# Patient Record
Sex: Female | Born: 1975 | Race: White | Hispanic: No | Marital: Married | Smoking: Never smoker
Health system: Southern US, Community
[De-identification: ages and names within clinical notes are randomized; demographics above are authoritative.]

## PROBLEM LIST (undated history)

## (undated) DIAGNOSIS — I10 Essential (primary) hypertension: Secondary | ICD-10-CM

## (undated) HISTORY — DX: Essential (primary) hypertension: I10

---

## 2016-02-24 ENCOUNTER — Ambulatory Visit: Payer: Self-pay | Admitting: Podiatry

## 2016-03-23 ENCOUNTER — Ambulatory Visit (INDEPENDENT_AMBULATORY_CARE_PROVIDER_SITE_OTHER): Payer: BLUE CROSS/BLUE SHIELD | Admitting: Podiatry

## 2016-03-23 ENCOUNTER — Encounter: Payer: Self-pay | Admitting: Podiatry

## 2016-03-23 VITALS — BP 139/87 | HR 71 | Resp 16

## 2016-03-23 DIAGNOSIS — B070 Plantar wart: Secondary | ICD-10-CM

## 2016-03-23 HISTORY — DX: Plantar wart: B07.0

## 2016-03-23 NOTE — Patient Instructions (Signed)
Take dressing off in 8 hours and wash the foot with soap and water. If it is hurting or becomes uncomfortable before the 8 hours, go ahead and remove the bandage and wash the area.  If it blisters, apply antibiotic ointment and a band-aid.  Monitor for any signs/symptoms of infection. Call the office immediately if any occur or go directly to the emergency room. Call with any questions/concerns.   

## 2016-03-23 NOTE — Progress Notes (Signed)
   Subjective:    Patient ID: Theresa West, female    DOB: 04/17/1975, 41 y.o.   MRN: [id number]  HPI [age] year old female presents the office today for concerns of a possible wart to the bottom of her left foot which is been ongoing for several years. She has tried multiple conservative treatments over-the-counter without any improvement. She states the areas painful with pressure in shoes. Denies any redness or drainage or any swelling. No other complaints at this time.   Review of Systems  All other systems reviewed and are negative.      Objective:   Physical Exam General: AAO x3, NAD  Dermatological: Hyperkeratotic lesion left foot sub-metatarsal 5. Upon debridement there is pinpoint bleeding and evidence of verruca. There is no swelling erythema, drainage or pus. No other open lesions or pre-ulcerative lesion identified today.  Vascular: Dorsalis Pedis artery and Posterior Tibial artery pedal pulses are 2/4 bilateral with immedate capillary fill time. Pedal hair growth present. There is no pain with calf compression, swelling, warmth, erythema.   Neruologic: Grossly intact via light touch bilateral. Vibratory intact via tuning fork bilateral. Protective threshold with Semmes Wienstein monofilament intact to all pedal sites bilateral.   Musculoskeletal: No gross boney pedal deformities bilateral. No pain, crepitus, or limitation noted with foot and ankle range of motion bilateral. Muscular strength 5/5 in all groups tested bilateral.  Gait: Unassisted, Nonantalgic.      Assessment & Plan:  41 year old female left foot verruca -Treatment options discussed including all alternatives, risks, and complications -Etiology of symptoms were discussed -Lesion was debrided today without complications. The area was cleaned. Cantharone was applied followed by an occlusive bandage. Post procedure instructions were discussed. -Discussed the symptoms continued surgical excision. -RTC in 3 weeks  or sooner if needed  Ovid CurdMatthew Gregorio Worley, DPM

## 2016-04-14 ENCOUNTER — Ambulatory Visit: Payer: BLUE CROSS/BLUE SHIELD | Admitting: Podiatry

## 2016-04-20 ENCOUNTER — Ambulatory Visit (INDEPENDENT_AMBULATORY_CARE_PROVIDER_SITE_OTHER): Payer: BLUE CROSS/BLUE SHIELD | Admitting: Podiatry

## 2016-04-20 ENCOUNTER — Encounter: Payer: Self-pay | Admitting: Podiatry

## 2016-04-20 DIAGNOSIS — B070 Plantar wart: Secondary | ICD-10-CM

## 2016-04-20 NOTE — Patient Instructions (Signed)
Take dressing off in 8 hours and wash the foot with soap and water. If it is hurting or becomes uncomfortable before the 8 hours, go ahead and remove the bandage and wash the area.  If it blisters, apply antibiotic ointment and a band-aid.  Monitor for any signs/symptoms of infection. Call the office immediately if any occur or go directly to the emergency room. Call with any questions/concerns.   

## 2016-04-24 NOTE — Progress Notes (Signed)
Subjective: 41 year old female presents the also concerns of a wart to the bottom of her left foot which is been ongoing for quite some time. [facility] and has improved although she is requesting another treatment today. She had some pain after the first day of the medication last appointment otherwise she was doing well. She denies any redness or drainage or any other signs of infection. Denies any systemic complaints such as fevers, chills, nausea, vomiting. No acute changes since last appointment, and no other complaints at this time.   Objective: AAO x3, NAD DP/PT pulses palpable bilaterally, CRT less than 3 seconds Hyperkeratotic lesion left foot some metatarsal 5. Upon debridement there was evidence of verruca although it does appear to be improved. Verruca does continue. Has not a any redness or drainage or any other signs of infection. No open lesions or pre-ulcerative lesions.  No pain with calf compression, swelling, warmth, erythema  Assessment: Verruca left foot.  Plan: -All treatment options discussed with the patient including all alternatives, risks, complications.  -Lesion sharply debrided without complications. The area was cleaned and Cantharone was applied followed by an occlusive bandage. Post procedure instructions were discussed. Monitor for infection. -RTC 3 weeks or sooner if needed. -Patient encouraged to call the office with any questions, concerns, change in symptoms.   Ovid CurdMatthew Wagoner, DPM

## 2016-05-12 DIAGNOSIS — Z818 Family history of other mental and behavioral disorders: Secondary | ICD-10-CM | POA: Insufficient documentation

## 2016-05-19 ENCOUNTER — Ambulatory Visit: Payer: BLUE CROSS/BLUE SHIELD | Admitting: Podiatry

## 2017-05-19 DIAGNOSIS — I10 Essential (primary) hypertension: Secondary | ICD-10-CM | POA: Insufficient documentation

## 2018-06-27 ENCOUNTER — Other Ambulatory Visit: Payer: Self-pay | Admitting: Family Medicine

## 2018-06-27 DIAGNOSIS — Z1231 Encounter for screening mammogram for malignant neoplasm of breast: Secondary | ICD-10-CM

## 2018-09-19 ENCOUNTER — Ambulatory Visit
Admission: RE | Admit: 2018-09-19 | Discharge: 2018-09-19 | Disposition: A | Discharge status: Home / Self Care | Payer: BC Managed Care – PPO | Source: Ambulatory Visit | Attending: Family Medicine | Admitting: Family Medicine

## 2018-09-19 DIAGNOSIS — Z1231 Encounter for screening mammogram for malignant neoplasm of breast: Secondary | ICD-10-CM | POA: Insufficient documentation

## 2020-12-24 IMAGING — MG DIGITAL SCREENING BILATERAL MAMMOGRAM WITH TOMO AND CAD
6 of 10 series · 6 of 30 positions shown · non-contrast
Comparison: None.

ACR Breast Density Category a: The breast tissue is almost entirely
fatty.

CLINICAL DATA: Screening.

EXAM:
DIGITAL SCREENING BILATERAL MAMMOGRAM WITH TOMO AND CAD

[R MLO synth-2D]
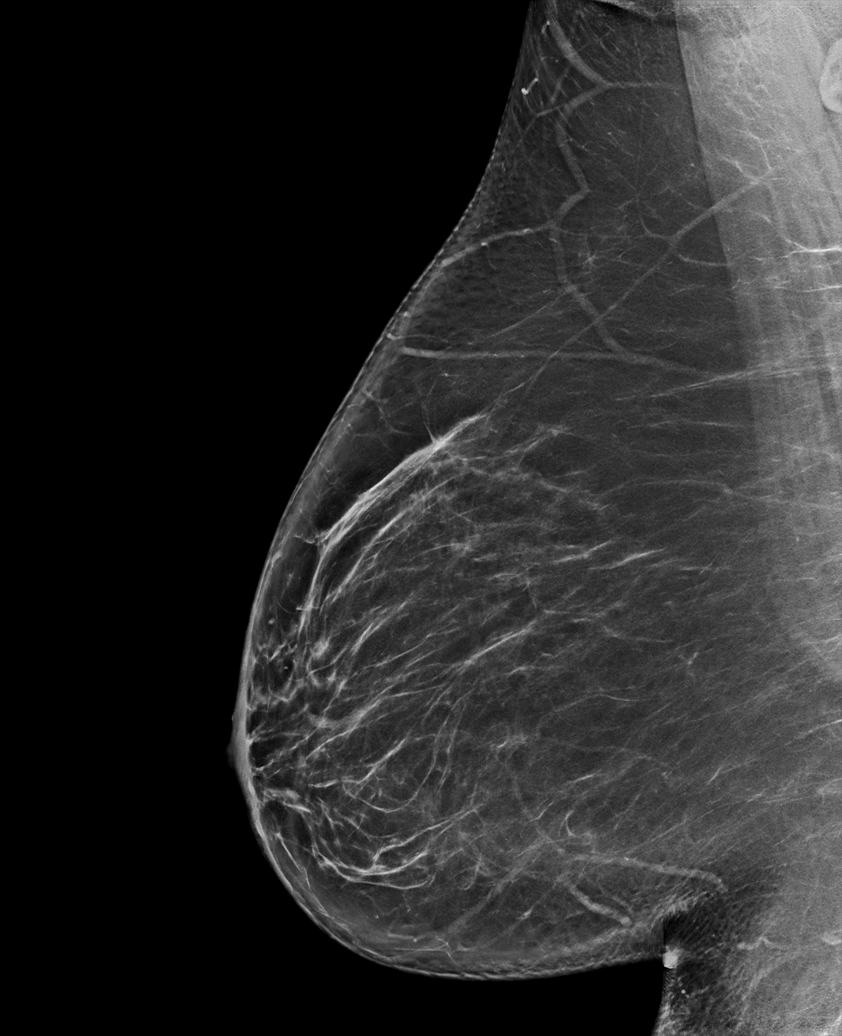

[L CC synth-2D]
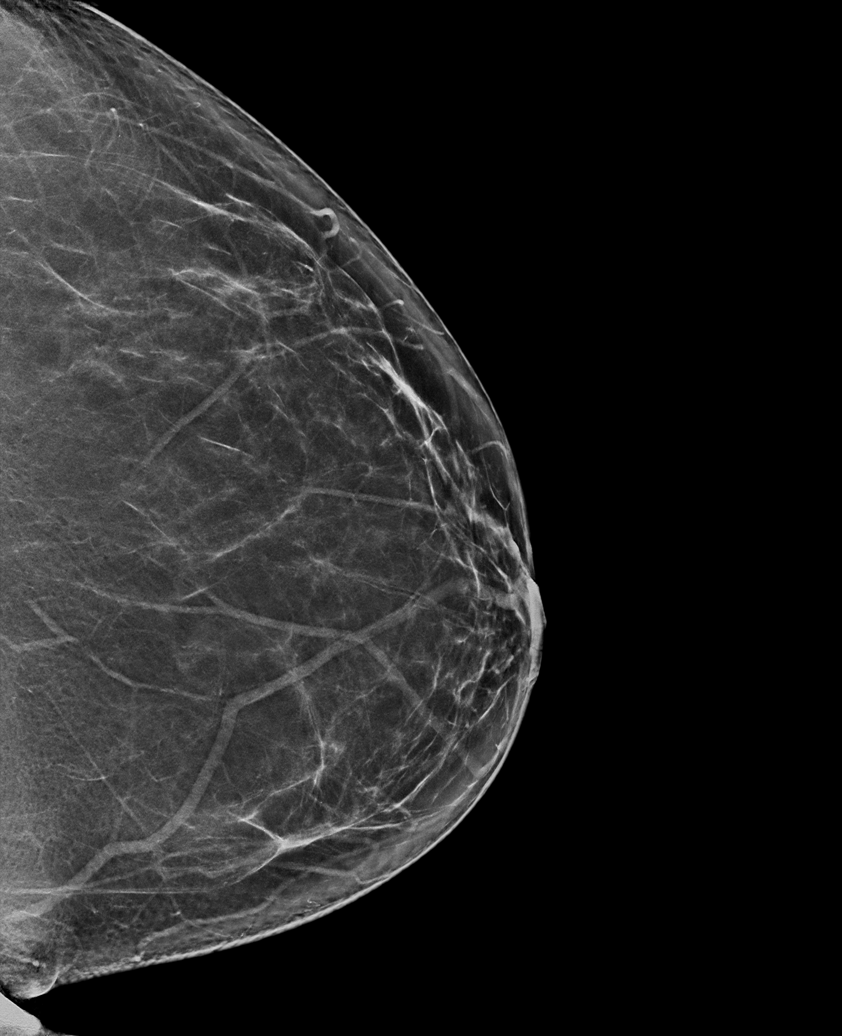

[R CC synth-2D]
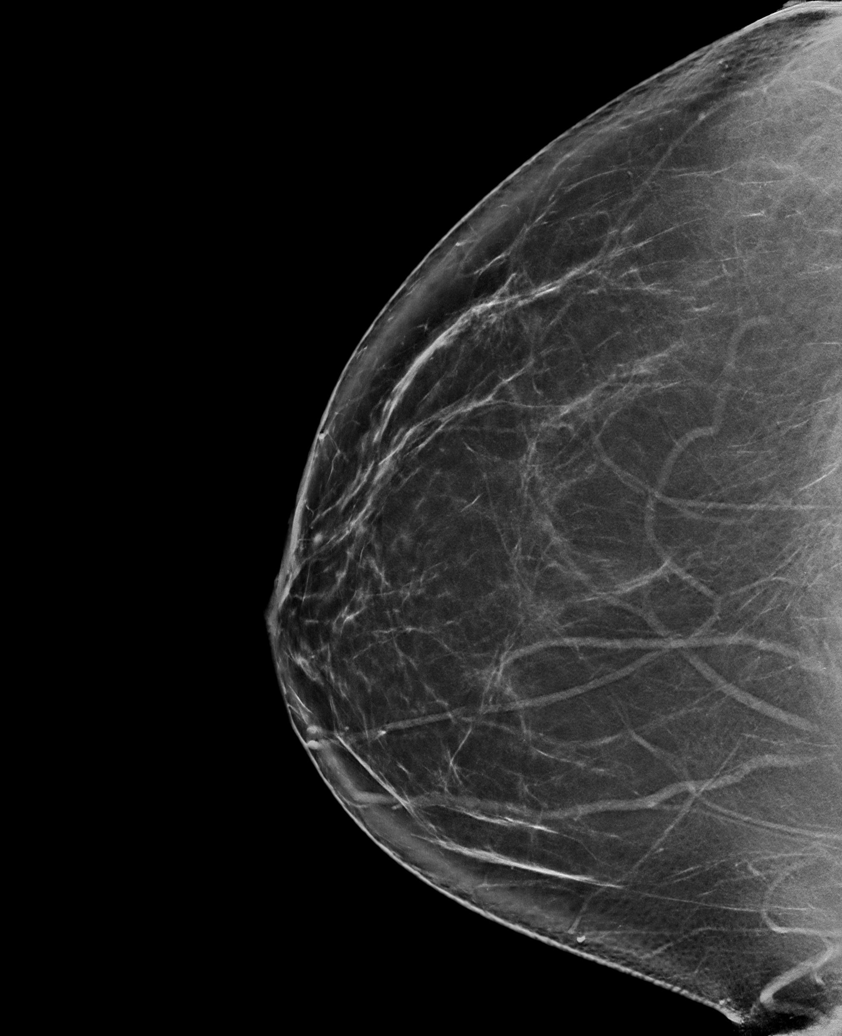

[L MLO synth-2D]
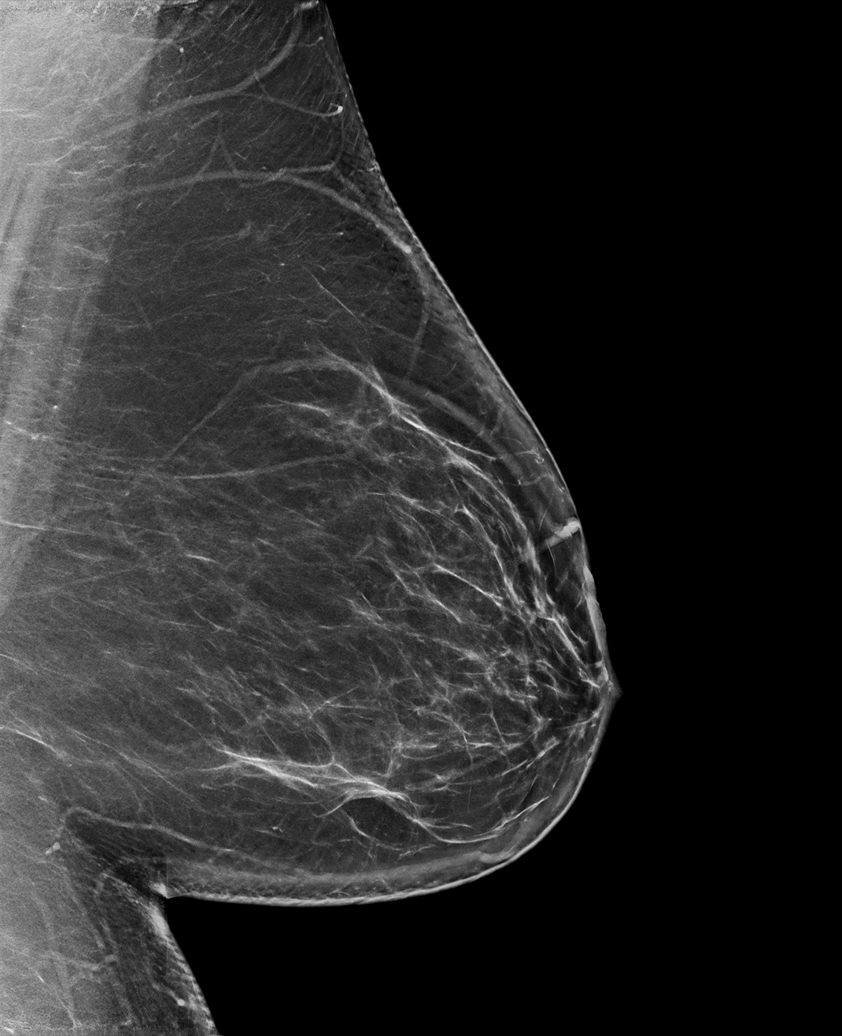

[L XCCL synth-2D]
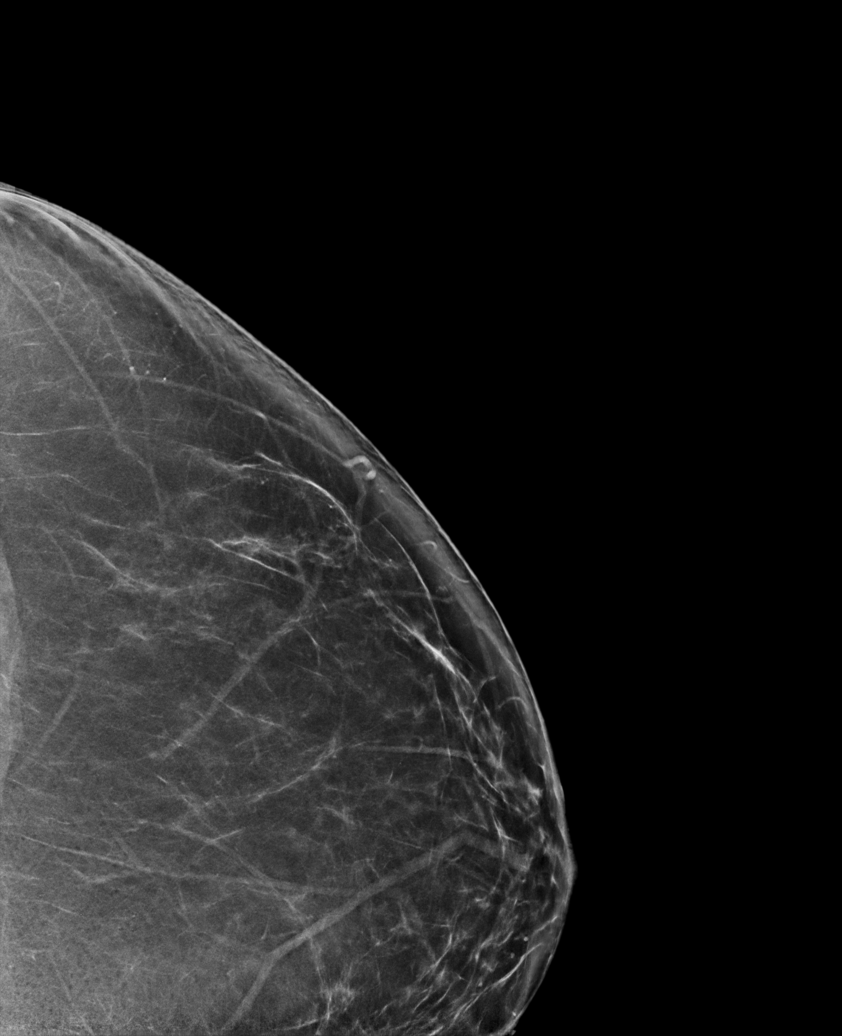

[L MLO tomo · tomo slice 47/93.0]
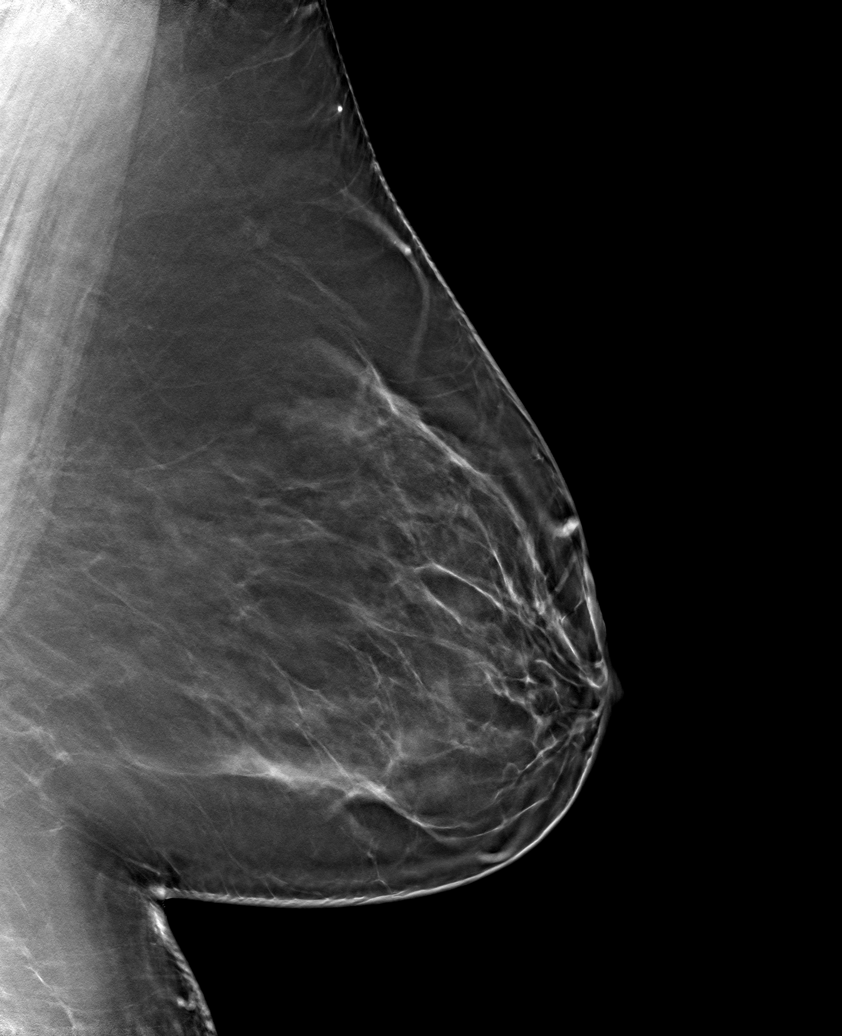

[6 of 30 positions shown; findings below may reference images not displayed]

FINDINGS: There are no findings suspicious for malignancy. Images were
processed with CAD.
IMPRESSION: No mammographic evidence of malignancy. A result letter of this
screening mammogram will be mailed directly to the patient.

RECOMMENDATION:
Screening mammogram in one year. (Code:[id number])

BI-RADS CATEGORY  1: Negative.

## 2021-04-25 ENCOUNTER — Ambulatory Visit: Payer: Self-pay | Admitting: Internal Medicine

## 2021-05-16 ENCOUNTER — Ambulatory Visit: Payer: BC Managed Care – PPO | Admitting: Family

## 2021-07-08 ENCOUNTER — Ambulatory Visit: Payer: Self-pay | Admitting: Family

## 2023-10-03 ENCOUNTER — Ambulatory Visit (INDEPENDENT_AMBULATORY_CARE_PROVIDER_SITE_OTHER): Admitting: Family Medicine

## 2023-10-03 ENCOUNTER — Encounter: Payer: Self-pay | Admitting: Family Medicine

## 2023-10-03 VITALS — BP 153/90 | HR 84 | Ht 68.0 in | Wt 305.0 lb

## 2023-10-03 DIAGNOSIS — I10 Essential (primary) hypertension: Secondary | ICD-10-CM

## 2023-10-03 DIAGNOSIS — G44229 Chronic tension-type headache, not intractable: Secondary | ICD-10-CM | POA: Diagnosis not present

## 2023-10-03 DIAGNOSIS — Z136 Encounter for screening for cardiovascular disorders: Secondary | ICD-10-CM

## 2023-10-03 DIAGNOSIS — N951 Menopausal and female climacteric states: Secondary | ICD-10-CM | POA: Diagnosis not present

## 2023-10-03 DIAGNOSIS — Z0001 Encounter for general adult medical examination with abnormal findings: Secondary | ICD-10-CM | POA: Diagnosis not present

## 2023-10-03 DIAGNOSIS — Z0000 Encounter for general adult medical examination without abnormal findings: Secondary | ICD-10-CM

## 2023-10-03 DIAGNOSIS — Z1231 Encounter for screening mammogram for malignant neoplasm of breast: Secondary | ICD-10-CM

## 2023-10-03 DIAGNOSIS — R7303 Prediabetes: Secondary | ICD-10-CM

## 2023-10-03 DIAGNOSIS — Z1211 Encounter for screening for malignant neoplasm of colon: Secondary | ICD-10-CM

## 2023-10-03 DIAGNOSIS — Z23 Encounter for immunization: Secondary | ICD-10-CM

## 2023-10-03 MED ORDER — AMLODIPINE BESYLATE 10 MG PO TABS: 10.0000 mg | ORAL_TABLET | Freq: Every day | ORAL | 3 refills | Status: DC

## 2023-10-03 MED ORDER — LISINOPRIL 10 MG PO TABS: 10.0000 mg | ORAL_TABLET | Freq: Every day | ORAL | 0 refills | Status: DC

## 2023-10-03 NOTE — Progress Notes (Addendum)
 New patient visit   Patient: Theresa West   DOB: 09/14/1975   47 y.o. Female  MRN: [id number] Visit Date: 10/03/2023  Today's healthcare provider: LAURAINE LOISE BUOY, DO   Chief Complaint  Patient presents with   New Patient (Initial Visit)    No concerns, agreed tdap, wait for pap(on menstrual), clinic out of other vaccines    Subjective    Theresa West is a 48 y.o. female who presents today as a new patient to establish care.  HPI HPI     New Patient (Initial Visit)    Additional comments: No concerns, agreed tdap, wait for pap(on menstrual), clinic out of other vaccines       Last edited by Thelbert Eulalio HERO, CMA on 10/03/2023  1:42 PM.      Theresa West is a 48 year old female who presents for a routine check-up and vaccination update.  She has not had a tetanus shot in the last ten years and is due for one. She is unsure if she has completed the HPV vaccine series. She typically receives her flu shot at her daughter's pediatrician's office for convenience.  She has a history of hypertension and is currently taking amlodipine . She has a significant supply of the medication due to previous insurance-related pharmacy issues. She does not monitor her blood pressure at home.  She experiences headaches that are not relieved by over-the-counter medications like ibuprofen or Excedrin Migraine. These headaches are described as non-debilitating and 'like a hangnail in the head,' occurring diffusely. A past CT scan was normal. She associates the headaches with stress and her busy lifestyle, which includes frequent travel due to her daughter's performance schedule.  She leads a very active lifestyle with frequent travel and homeschools her daughter. Her husband has been away for extended periods due to family obligations. She reports no history of anemia, fatigue, or significant sleep disturbances, although she does experience hot flashes and perimenopausal symptoms.  She has not  had a mammogram since 2020 and has never had a colonoscopy. She has not been screened for hepatitis C or HIV and expresses concerns about her insurance coverage for these tests.  She declines them today.  For contraception, her husband has underwent a vasectomy.  No history of seizures, tremors, or significant neurological symptoms. Occasional anxiety due to her lifestyle but no depression.     Past Medical History:  Diagnosis Date   Family history of autism 05/12/2016   daughter born 2012, high functioning. Home schooling. Signficant stress on family, as of 05/2016 she and husband wanting to implement healthier lifestyle     Plantar wart 03/23/2016   History reviewed. No pertinent surgical history. Family Status  Relation Name Status   Mother  Alive   Father  Deceased  No partnership data on file   History reviewed. No pertinent family history. Social History   Socioeconomic History   Marital status: Married    Spouse name: Not on file   Number of children: Not on file   Years of education: Not on file   Highest education level: Some college, no degree  Occupational History   Not on file  Tobacco Use   Smoking status: Never   Smokeless tobacco: Never  Substance and Sexual Activity   Alcohol use: No   Drug use: No   Sexual activity: Not Currently  Other Topics Concern   Not on file  Social History Narrative   Not on file   Social Drivers of  Health   Financial Resource Strain: Low Risk  (09/30/2023)   Overall Financial Resource Strain (CARDIA)    Difficulty of Paying Living Expenses: Not hard at all  Food Insecurity: No Food Insecurity (09/30/2023)   Hunger Vital Sign    Worried About Running Out of Food in the Last Year: Never true    Ran Out of Food in the Last Year: Never true  Transportation Needs: No Transportation Needs (09/30/2023)   PRAPARE - Administrator, Civil Service (Medical): No    Lack of Transportation (Non-Medical): No  Physical  Activity: Insufficiently Active (09/30/2023)   Exercise Vital Sign    Days of Exercise per Week: 2 days    Minutes of Exercise per Session: 20 min  Stress: Stress Concern Present (09/30/2023)   [employer] of Occupational Health - Occupational Stress Questionnaire    Feeling of Stress: To some extent  Social Connections: Moderately Isolated (09/30/2023)   Social Connection and Isolation Panel    Frequency of Communication with Friends and Family: Twice a week    Frequency of Social Gatherings with Friends and Family: Once a week    Attends Religious Services: Never    Database administrator or Organizations: No    Attends Engineer, structural: Not on file    Marital Status: Married   Outpatient Medications Prior to Visit  Medication Sig   [DISCONTINUED] amLODipine  (NORVASC ) 10 MG tablet Take 10 mg by mouth daily.   No facility-administered medications prior to visit.   No Known Allergies  Immunization History  Administered Date(s) Administered   Influenza,inj,Quad PF,6+ Mos 11/03/2016, 11/02/2017   Influenza-Unspecified 10/08/2015, 11/02/2017, 10/23/2018   PFIZER Comirnaty(Gray Top)Covid-19 Tri-Sucrose Vaccine 04/03/2019, 04/14/2019   Tdap 08/11/2012, 10/03/2023    Health Maintenance  Topic Date Due   Hepatitis B Vaccines 19-59 Average Risk (1 of 3 - 19+ 3-dose series) Never done   Cervical Cancer Screening (HPV/Pap Cotest)  Never done   Fecal DNA (Cologuard)  Never done   INFLUENZA VACCINE  05/06/2024 (Originally 09/07/2023)   COVID-19 Vaccine (3 - 2024-25 season) 09/30/2024 (Originally 10/08/2022)   Hepatitis C Screening  10/02/2024 (Originally 10/16/1995)   HIV Screening  10/02/2024 (Originally 10/15/1992)   DTaP/Tdap/Td (3 - Td or Tdap) 10/02/2033   Pneumococcal Vaccine  Aged Out   Meningococcal B Vaccine  Aged Out   HPV VACCINES  Discontinued    Patient Care Team: Taegen Lennox, Lauraine SAILOR, DO as PCP - General (Family Medicine)  Review of Systems  Constitutional:   Negative for chills, fatigue and fever.  HENT:  Negative for congestion, ear pain, rhinorrhea, sneezing and sore throat.   Eyes: Negative.  Negative for pain and redness.  Respiratory:  Negative for cough, shortness of breath and wheezing.   Cardiovascular:  Negative for chest pain and leg swelling.  Gastrointestinal:  Negative for abdominal pain, blood in stool, constipation, diarrhea and nausea.  Endocrine: Positive for heat intolerance (hot flashes). Negative for polydipsia and polyphagia.  Genitourinary: Negative.  Negative for dysuria, flank pain, hematuria, pelvic pain, vaginal bleeding and vaginal discharge.  Musculoskeletal:  Negative for arthralgias, back pain, gait problem and joint swelling.  Skin:  Negative for rash.  Neurological:  Positive for headaches. Negative for dizziness, tremors, seizures, weakness, light-headedness and numbness.  Hematological:  Negative for adenopathy.  Psychiatric/Behavioral: Negative.  Negative for behavioral problems, confusion and dysphoric mood. The patient is not nervous/anxious and is not hyperactive.         Objective  BP (!) 153/90 (BP Location: Right Arm, Patient Position: Sitting, Cuff Size: Normal)   Pulse 84   Ht 5' 8 (1.727 m)   Wt (!) 305 lb (138.3 kg)   SpO2 100%   BMI 46.38 kg/m     Physical Exam Vitals and nursing note reviewed.  Constitutional:      General: She is awake. She is not in acute distress.    Appearance: Normal appearance. She is morbidly obese.  HENT:     Head: Normocephalic and atraumatic.     Right Ear: Tympanic membrane, ear canal and external ear normal.     Left Ear: Tympanic membrane, ear canal and external ear normal.     Nose: Nose normal.     Mouth/Throat:     Mouth: Mucous membranes are moist.     Pharynx: Oropharynx is clear. No oropharyngeal exudate or posterior oropharyngeal erythema.  Eyes:     General: No scleral icterus.    Extraocular Movements: Extraocular movements intact.      Conjunctiva/sclera: Conjunctivae normal.     Pupils: Pupils are equal, round, and reactive to light.  Neck:     Thyroid: No thyromegaly or thyroid tenderness.  Cardiovascular:     Rate and Rhythm: Normal rate and regular rhythm.     Pulses: Normal pulses.     Heart sounds: Normal heart sounds.  Pulmonary:     Effort: Pulmonary effort is normal. No tachypnea, bradypnea or respiratory distress.     Breath sounds: Normal breath sounds. No stridor. No wheezing, rhonchi or rales.  Abdominal:     General: Bowel sounds are normal. There is no distension.     Palpations: Abdomen is soft. There is no mass.     Tenderness: There is no abdominal tenderness. There is no guarding.     Hernia: No hernia is present.  Musculoskeletal:     Cervical back: Normal range of motion and neck supple.     Right lower leg: No edema.     Left lower leg: No edema.  Lymphadenopathy:     Cervical: No cervical adenopathy.  Skin:    General: Skin is warm and dry.  Neurological:     Mental Status: She is alert and oriented to person, place, and time. Mental status is at baseline.  Psychiatric:        Mood and Affect: Mood normal.        Behavior: Behavior normal.     Depression Screen    10/03/2023    1:47 PM  PHQ 2/9 Scores  PHQ - 2 Score 0  PHQ- 9 Score 1   No results found for any visits on 10/03/23.  Assessment & Plan     Encounter for medical examination to establish care  Essential hypertension -     Comprehensive metabolic panel with GFR -     Lipid panel -     amLODIPine  Besylate; Take 1 tablet (10 mg total) by mouth daily.  Dispense: 90 tablet; Refill: 3 -     Lisinopril ; Take 1 tablet (10 mg total) by mouth daily.  Dispense: 90 tablet; Refill: 0  Perimenopausal symptoms  Chronic tension-type headache, not intractable  Morbid obesity with body mass index of 40.0-49.9 (HCC)  Encounter for colorectal cancer screening -     Cologuard  Encounter for screening mammogram for breast  cancer -     3D Screening Mammogram, Left and Right; Future  Prediabetes -     Hemoglobin A1c  Encounter for  screening for cardiovascular disorders -     Lipid panel  Need for Tdap vaccination -     Tdap vaccine greater than or equal to 7yo IM      Encounter for medical examination to establish care Physical exam overall unremarkable except as noted above. Routine lab work ordered as noted.  Due for tetanus vaccine, Pap smear, mammogram, and colon cancer screening. Prefers Cologuard for colon cancer screening due to busy schedule and reluctance for colonoscopy. - Schedule tetanus vaccine. - Schedule Pap smear in five weeks. - Order mammogram. - Send Cologuard kit for colon cancer screening. - Discuss hepatitis C and HIV screening at a later date.  Essential hypertension Long-standing hypertension on amlodipine  10 mg daily with elevated blood pressure today. No history of white coat syndrome. Plan to add lisinopril  to current regimen. Importance of home blood pressure monitoring and dosage adjustment discussed. - Start lisinopril  10 mg daily. - Instruct to monitor blood pressure at home. - If blood pressure remains above 130/80 mmHg after two weeks, double the lisinopril  dose. - Follow up in four weeks to recheck blood pressure.  Prediabetes Patient does have a history of prediabetes and was previously started on but unable to tolerate metformin , experiencing episodes of significant diarrhea and a feeling like she was going to pass out while driving.   - Will recheck her A1c today.  Perimenopausal symptoms Experiencing hot flashes, particularly the week before her period. Periods are still regular. Symptoms consistent with perimenopause.  Tension-type headaches Intermittent tension-type headaches, possibly related to stress and lifestyle. Not debilitating and unresponsive to ibuprofen or Excedrin Migraine. Recent CT scan was clean. - Consider lifestyle modifications to reduce  headache frequency.  Morbid obesity (BMI 40.0-49.9) Counseled patient on lifestyle modifications    Return in about 5 weeks (around 11/07/2023) for HTN, pap.     I discussed the assessment and treatment plan with the patient  The patient was provided an opportunity to ask questions and all were answered. The patient agreed with the plan and demonstrated an understanding of the instructions.   The patient was advised to call back or seek an in-person evaluation if the symptoms worsen or if the condition fails to improve as anticipated.    LAURAINE LOISE BUOY, DO  [facility] [phone] (phone) [phone] (fax)  [facility]

## 2023-10-03 NOTE — Patient Instructions (Addendum)
 Please call the [facility] [phone]) to schedule a routine screening mammogram.   Take lisinopril  10 mg daily for two weeks, if, after two weeks, your blood pressure >130/80, take two tablets daily (total lisinopril  20 mg daily; ok to take together). ________________________________________________  Check your blood pressure once daily, and any time you have concerning symptoms like headache, chest pain, dizziness, shortness of breath, or vision changes.   Our goal is less than 130/80.  To appropriately check your blood pressure, make sure you do the following:  1) Avoid caffeine, exercise, or tobacco products for 30 minutes before checking. Empty your bladder. 2) Sit with your back supported in a flat-backed chair. Rest your arm on something flat (arm of the chair, table, etc). 3) Sit still with your feet flat on the floor, resting, for at least 5 minutes.  4) Check your blood pressure. Take 1-2 readings.  5) Write down these readings and bring with you to any provider appointments.  Bring your home blood pressure machine with you to a provider's office for accuracy comparison at least once a year.   Make sure you take your blood pressure medications before you come to any office visit, even if you were asked to fast for labs.

## 2023-10-04 LAB — COMPREHENSIVE METABOLIC PANEL WITH GFR
ALT: 29 IU/L (ref 0–32)
AST: 18 IU/L (ref 0–40)
Albumin: 4.4 g/dL (ref 3.9–4.9)
Alkaline Phosphatase: 101 IU/L (ref 44–121)
BUN/Creatinine Ratio: 18 (ref 9–23)
BUN: 14 mg/dL (ref 6–24)
Bilirubin Total: 0.3 mg/dL (ref 0.0–1.2)
CO2: 23 mmol/L (ref 20–29)
Calcium: 9.6 mg/dL (ref 8.7–10.2)
Chloride: 100 mmol/L (ref 96–106)
Creatinine, Ser: 0.78 mg/dL (ref 0.57–1.00)
Globulin, Total: 2.5 g/dL (ref 1.5–4.5)
Glucose: 259 mg/dL — ABNORMAL HIGH (ref 70–99)
Potassium: 3.8 mmol/L (ref 3.5–5.2)
Sodium: 139 mmol/L (ref 134–144)
Total Protein: 6.9 g/dL (ref 6.0–8.5)
eGFR: 95 mL/min/1.73 (ref >59)

## 2023-10-04 LAB — LIPID PANEL
Chol/HDL Ratio: 4.6 ratio — ABNORMAL HIGH (ref 0.0–4.4)
Cholesterol, Total: 199 mg/dL (ref 100–199)
HDL: 43 mg/dL (ref >39)
LDL Chol Calc (NIH): 119 mg/dL — ABNORMAL HIGH (ref 0–99)
Triglycerides: 211 mg/dL — ABNORMAL HIGH (ref 0–149)
VLDL Cholesterol Cal: 37 mg/dL (ref 5–40)

## 2023-10-04 LAB — HEMOGLOBIN A1C
Est. average glucose Bld gHb Est-mCnc: 192 mg/dL
Hgb A1c MFr Bld: 8.3 % — ABNORMAL HIGH (ref 4.8–5.6)

## 2023-10-09 ENCOUNTER — Ambulatory Visit: Payer: Self-pay | Admitting: Family Medicine

## 2023-10-09 DIAGNOSIS — R7309 Other abnormal glucose: Secondary | ICD-10-CM

## 2023-10-11 MED ORDER — METFORMIN HCL ER 500 MG PO TB24: ORAL_TABLET | ORAL | 1 refills | Status: DC

## 2023-11-07 ENCOUNTER — Encounter: Payer: Self-pay | Admitting: Family Medicine

## 2023-11-07 ENCOUNTER — Other Ambulatory Visit (HOSPITAL_COMMUNITY)
Admission: RE | Admit: 2023-11-07 | Discharge: 2023-11-07 | Disposition: A | Discharge status: Home / Self Care | Source: Ambulatory Visit | Attending: Family Medicine | Admitting: Family Medicine

## 2023-11-07 ENCOUNTER — Ambulatory Visit: Admitting: Family Medicine

## 2023-11-07 VITALS — BP 136/81 | HR 78 | Wt 302.1 lb

## 2023-11-07 DIAGNOSIS — N898 Other specified noninflammatory disorders of vagina: Secondary | ICD-10-CM

## 2023-11-07 DIAGNOSIS — E1169 Type 2 diabetes mellitus with other specified complication: Secondary | ICD-10-CM

## 2023-11-07 DIAGNOSIS — Z124 Encounter for screening for malignant neoplasm of cervix: Secondary | ICD-10-CM | POA: Insufficient documentation

## 2023-11-07 DIAGNOSIS — Z23 Encounter for immunization: Secondary | ICD-10-CM

## 2023-11-07 DIAGNOSIS — I10 Essential (primary) hypertension: Secondary | ICD-10-CM | POA: Diagnosis not present

## 2023-11-07 DIAGNOSIS — Z7984 Long term (current) use of oral hypoglycemic drugs: Secondary | ICD-10-CM

## 2023-11-07 NOTE — Progress Notes (Signed)
 Established patient visit   Patient: Theresa West   DOB: 10/26/1975   48 y.o. Female  MRN: [id number] Visit Date: 11/07/2023  Today's healthcare provider: LAURAINE LOISE BUOY, DO   Chief Complaint  Patient presents with   Follow-up    HTN, PAP   Subjective    HPI Theresa West is a 48 year old female who presents for follow-up on hypertension and diabetes management.  She has been on lisinopril  10 mg for hypertension for two weeks and two days. Initially, she delayed starting the medication due to concerns about potential side effects, such as syncope, until she was in a stable environment. She feels the medication is going well so far. She is also taking amlodipine  for blood pressure management.  She has started taking metformin  extended release for diabetes management and describes the experience as 'amazing,' indicating a positive response to the medication. She is currently taking one tablet daily, although she was initially unaware that the dosage was intended to be increased gradually to four times daily.  She is scheduled for a Pap smear today and reports no issues with discharge, itching, odor, pain, or dysuria. No concerns about sexually transmitted infections. Her last menstrual period started on September 24 and has concluded.  She has not yet completed the Cologuard test but has scheduled a mammogram. She plans to receive the COVID-19 booster and flu vaccine on October 25 at her daughter's pediatrician's office. She has not received the hepatitis B vaccine previously.  She has a daughter and her husband travels frequently.   LMP: 10/31/23     Medications: Outpatient Medications Prior to Visit  Medication Sig   amLODipine  (NORVASC ) 10 MG tablet Take 1 tablet (10 mg total) by mouth daily.   lisinopril  (ZESTRIL ) 10 MG tablet Take 1 tablet (10 mg total) by mouth daily.   metFORMIN  (GLUCOPHAGE -XR) 500 MG 24 hr tablet Take with meals.  Week 1: Take 1 tablet daily.   Week 2: Take 1 tablet twice daily.  Week 3: Take 2 tablets in the morning and 1 tablet in the evening.  Week 4: Take 2 tablets twice daily.  If unable to tolerate higher dose, stay at maximum tolerated dose.   No facility-administered medications prior to visit.        Objective    BP 136/81 (BP Location: Left Arm, Patient Position: Sitting, Cuff Size: Large)   Pulse 78   Wt (!) 302 lb 1.6 oz (137 kg)   LMP 10/31/2023 (Exact Date)   SpO2 100%   BMI 45.93 kg/m     Physical Exam Vitals and nursing note reviewed.  Constitutional:      General: She is not in acute distress.    Appearance: Normal appearance.  HENT:     Head: Normocephalic and atraumatic.  Eyes:     General: No scleral icterus.    Conjunctiva/sclera: Conjunctivae normal.  Cardiovascular:     Rate and Rhythm: Normal rate.  Pulmonary:     Effort: Pulmonary effort is normal.  Genitourinary:    General: Normal vulva.     Exam position: Lithotomy position.     Pubic Area: No rash.      Tanner stage (genital): 5.     Labia:        Right: No rash, tenderness, lesion or injury.        Left: No rash, tenderness, lesion or injury.      Vagina: No signs of injury and foreign body. Vaginal  discharge (yellowish/whitish discharge) present. No erythema, tenderness, bleeding, lesions or prolapsed vaginal walls.     Cervix: Lesion (suspected nabothian cyst (approx. 3 mm diameter pale, pink, circular lesion with whitish center)) present. No cervical motion tenderness, discharge, friability, erythema, cervical bleeding or eversion.     Uterus: Normal.      Adnexa: Right adnexa normal and left adnexa normal.     Neurological:     Mental Status: She is alert and oriented to person, place, and time. Mental status is at baseline.  Psychiatric:        Mood and Affect: Mood normal.        Behavior: Behavior normal.      Results for orders placed or performed in visit on 11/07/23  Cytology - PAP  Result Value Ref Range    High risk HPV Negative    Adequacy      Satisfactory for evaluation; transformation zone component PRESENT.   Diagnosis      - Negative for Intraepithelial Lesions or Malignancy (NILM)   Diagnosis - Benign reactive/reparative changes    Microorganisms      Fungal organisms present consistent with Candida spp.   Comment Normal Reference Range HPV - Negative   Cervicovaginal ancillary only  Result Value Ref Range   Bacterial Vaginitis (gardnerella) Negative    Candida Vaginitis Positive (A)    Candida Glabrata Negative    Comment      Normal Reference Range Bacterial Vaginosis - Negative   Comment Normal Reference Range Candida Species - Negative    Comment Normal Reference Range Candida Galbrata - Negative     Assessment & Plan    Pap smear for cervical cancer screening -     Cytology - PAP  Essential hypertension  Hepatitis B vaccination administered at current visit -     Heplisav-B (HepB-CPG) Vaccine  Vaginal discharge -     Cervicovaginal ancillary only  Type 2 diabetes mellitus with other specified complication, without long-term current use of insulin (HCC)     Pap smear for cervical cancer screening; vaginal discharge Cervical cancer screening is due. No symptoms or concerns about STIs. Whitish discharge noted, swab taken for yeast infection. - Perform Pap smear. - Perform bimanual exam. - Perform swab for yeast infection. - Provide antibiotics if swab indicates infection.  Essential hypertension Chronic, improved.  Blood pressure mildly elevated today. Tolerated lisinopril  and amlodipine  well despite initial concerns about side effects.  Continue without changes.  Type 2 diabetes mellitus with other specified complication, without current long-term use of insulin Type 2 diabetes with associated hypertension.  Currently on metformin  extended release once daily. Unaware of need to increase dosage. - Increase metformin  to two tablets daily as tolerated. -  Consider further increase to three or four tablets daily as tolerated.    Return in about 3 months (around 02/07/2024) for DM.      I discussed the assessment and treatment plan with the patient  The patient was provided an opportunity to ask questions and all were answered. The patient agreed with the plan and demonstrated an understanding of the instructions.   The patient was advised to call back or seek an in-person evaluation if the symptoms worsen or if the condition fails to improve as anticipated.    LAURAINE LOISE BUOY, DO  [facility] [phone] (phone) [phone] (fax)  [facility]

## 2023-11-09 LAB — CYTOLOGY - PAP
Comment: NEGATIVE
Diagnosis: NEGATIVE
Diagnosis: REACTIVE
High risk HPV: NEGATIVE

## 2023-11-09 LAB — CERVICOVAGINAL ANCILLARY ONLY
Bacterial Vaginitis (gardnerella): NEGATIVE
Candida Glabrata: NEGATIVE
Candida Vaginitis: POSITIVE — AB
Comment: NEGATIVE
Comment: NEGATIVE
Comment: NEGATIVE

## 2023-11-12 ENCOUNTER — Ambulatory Visit: Payer: Self-pay | Admitting: Family Medicine

## 2023-11-12 DIAGNOSIS — B3731 Acute candidiasis of vulva and vagina: Secondary | ICD-10-CM

## 2023-11-12 MED ORDER — FLUCONAZOLE 150 MG PO TABS: 150.0000 mg | ORAL_TABLET | Freq: Once | ORAL | 0 refills | Status: AC

## 2023-11-19 ENCOUNTER — Other Ambulatory Visit: Payer: Self-pay | Admitting: Family Medicine

## 2023-11-19 DIAGNOSIS — E1169 Type 2 diabetes mellitus with other specified complication: Secondary | ICD-10-CM

## 2023-11-19 LAB — COLOGUARD: COLOGUARD: NEGATIVE

## 2023-11-19 MED ORDER — ROSUVASTATIN CALCIUM 5 MG PO TABS: 5.0000 mg | ORAL_TABLET | Freq: Every evening | ORAL | 3 refills | Status: DC

## 2023-11-20 NOTE — Progress Notes (Signed)
 LMTCB have also sed message via mychart.

## 2023-11-22 ENCOUNTER — Telehealth: Payer: Self-pay | Admitting: Family Medicine

## 2023-11-22 DIAGNOSIS — I10 Essential (primary) hypertension: Secondary | ICD-10-CM

## 2023-11-22 MED ORDER — LISINOPRIL 10 MG PO TABS: 10.0000 mg | ORAL_TABLET | Freq: Every day | ORAL | 1 refills | Status: DC

## 2023-11-22 NOTE — Telephone Encounter (Signed)
 [facility] faxed refill request for the following medications:  lisinopril  (ZESTRIL ) 10 MG tablet    Please advise.

## 2023-11-27 ENCOUNTER — Telehealth: Payer: Self-pay | Admitting: Family Medicine

## 2023-11-27 NOTE — Telephone Encounter (Signed)
 [facility] faxed refill request for the following medications:  metFORMIN  (GLUCOPHAGE -XR) 500 MG 24 hr tablet   rosuvastatin (CRESTOR) 5 MG tablet   amLODipine  (NORVASC ) 10 MG tablet    Please advise.

## 2023-11-28 ENCOUNTER — Other Ambulatory Visit: Payer: Self-pay

## 2023-11-28 DIAGNOSIS — E1169 Type 2 diabetes mellitus with other specified complication: Secondary | ICD-10-CM

## 2023-11-28 DIAGNOSIS — I10 Essential (primary) hypertension: Secondary | ICD-10-CM

## 2023-11-28 DIAGNOSIS — R7309 Other abnormal glucose: Secondary | ICD-10-CM

## 2023-11-28 NOTE — Telephone Encounter (Signed)
 Medication has been filled different dates with refills. Medication were sent to [facility].

## 2023-11-29 ENCOUNTER — Encounter: Payer: Self-pay | Admitting: Family Medicine

## 2023-11-30 ENCOUNTER — Other Ambulatory Visit: Payer: Self-pay

## 2023-11-30 DIAGNOSIS — I10 Essential (primary) hypertension: Secondary | ICD-10-CM

## 2023-11-30 DIAGNOSIS — R7309 Other abnormal glucose: Secondary | ICD-10-CM

## 2023-11-30 DIAGNOSIS — E1169 Type 2 diabetes mellitus with other specified complication: Secondary | ICD-10-CM

## 2023-11-30 MED ORDER — ROSUVASTATIN CALCIUM 5 MG PO TABS: 5.0000 mg | ORAL_TABLET | Freq: Every evening | ORAL | 3 refills | Status: AC

## 2023-11-30 MED ORDER — AMLODIPINE BESYLATE 10 MG PO TABS: 10.0000 mg | ORAL_TABLET | Freq: Every day | ORAL | 3 refills | Status: AC

## 2023-11-30 MED ORDER — METFORMIN HCL ER 500 MG PO TB24: ORAL_TABLET | ORAL | 1 refills | Status: DC

## 2023-11-30 MED ORDER — LISINOPRIL 10 MG PO TABS: 10.0000 mg | ORAL_TABLET | Freq: Every day | ORAL | 1 refills | Status: AC

## 2023-12-03 ENCOUNTER — Ambulatory Visit
Admission: RE | Admit: 2023-12-03 | Discharge: 2023-12-03 | Disposition: A | Discharge status: Home / Self Care | Source: Ambulatory Visit | Attending: Family Medicine | Admitting: Family Medicine

## 2023-12-03 DIAGNOSIS — Z1231 Encounter for screening mammogram for malignant neoplasm of breast: Secondary | ICD-10-CM | POA: Diagnosis present

## 2023-12-04 ENCOUNTER — Ambulatory Visit: Payer: Self-pay

## 2023-12-04 LAB — SPECIMEN STATUS REPORT

## 2023-12-04 LAB — MICROALBUMIN / CREATININE URINE RATIO
Creatinine, Urine: 224.3 mg/dL
Microalb/Creat Ratio: 8 mg/g{creat} (ref 0–29)
Microalbumin, Urine: 18.5 ug/mL

## 2024-01-16 ENCOUNTER — Other Ambulatory Visit: Payer: Self-pay | Admitting: Family Medicine

## 2024-01-16 DIAGNOSIS — R7309 Other abnormal glucose: Secondary | ICD-10-CM

## 2024-02-20 ENCOUNTER — Ambulatory Visit: Admitting: Family Medicine

## 2024-03-05 ENCOUNTER — Ambulatory Visit: Admitting: Family Medicine

## 2024-03-26 ENCOUNTER — Ambulatory Visit: Admitting: Family Medicine
